# Patient Record
Sex: Male | Born: 1970 | Hispanic: No | Marital: Married | Smoking: Never smoker
Health system: Southern US, Community
[De-identification: ages and names within clinical notes are randomized; demographics above are authoritative.]

## PROBLEM LIST (undated history)

## (undated) DIAGNOSIS — R011 Cardiac murmur, unspecified: Secondary | ICD-10-CM

## (undated) HISTORY — DX: Cardiac murmur, unspecified: R01.1

---

## 2004-05-11 ENCOUNTER — Encounter: Admission: RE | Admit: 2004-05-11 | Discharge: 2004-05-11 | Payer: Self-pay | Admitting: Internal Medicine

## 2004-05-11 ENCOUNTER — Ambulatory Visit (HOSPITAL_COMMUNITY): Admission: RE | Admit: 2004-05-11 | Discharge: 2004-05-11 | Payer: Self-pay | Admitting: Internal Medicine

## 2006-04-11 ENCOUNTER — Ambulatory Visit: Payer: Self-pay | Admitting: Internal Medicine

## 2007-04-21 ENCOUNTER — Telehealth (INDEPENDENT_AMBULATORY_CARE_PROVIDER_SITE_OTHER): Payer: Self-pay | Admitting: *Deleted

## 2008-01-15 ENCOUNTER — Ambulatory Visit: Payer: Self-pay | Admitting: Internal Medicine

## 2008-02-11 ENCOUNTER — Ambulatory Visit: Payer: Self-pay | Admitting: Internal Medicine

## 2008-02-11 LAB — CONVERTED CEMR LAB
ALT: 33 units/L (ref 0–53)
AST: 21 units/L (ref 0–37)
Albumin: 4.6 g/dL (ref 3.5–5.2)
Basophils Absolute: 0.1 10*3/uL (ref 0.0–0.1)
Basophils Relative: 1 % (ref 0–1)
Chloride: 106 meq/L (ref 96–112)
Eosinophils Absolute: 0.2 10*3/uL (ref 0.0–0.7)
Eosinophils Relative: 3 % (ref 0–5)
HCT: 49.5 % (ref 39.0–52.0)
LDL Cholesterol: 72 mg/dL (ref 0–99)
Lymphocytes Relative: 36 % (ref 12–46)
Lymphs Abs: 2.7 10*3/uL (ref 0.7–4.0)
Monocytes Relative: 9 % (ref 3–12)
Neutro Abs: 3.8 10*3/uL (ref 1.7–7.7)
Platelets: 194 10*3/uL (ref 150–400)
Total CHOL/HDL Ratio: 3.3
Total Protein: 7 g/dL (ref 6.0–8.3)

## 2008-02-26 ENCOUNTER — Ambulatory Visit: Payer: Self-pay | Admitting: Internal Medicine

## 2008-05-26 ENCOUNTER — Ambulatory Visit: Payer: Self-pay | Admitting: Family Medicine

## 2009-10-26 ENCOUNTER — Ambulatory Visit: Payer: Self-pay | Admitting: Internal Medicine

## 2009-11-16 ENCOUNTER — Ambulatory Visit: Payer: Self-pay | Admitting: Internal Medicine

## 2010-02-21 ENCOUNTER — Ambulatory Visit: Payer: Self-pay | Admitting: Family Medicine

## 2010-04-13 ENCOUNTER — Ambulatory Visit: Payer: Self-pay | Admitting: Internal Medicine

## 2014-02-09 ENCOUNTER — Ambulatory Visit (INDEPENDENT_AMBULATORY_CARE_PROVIDER_SITE_OTHER): Payer: 59 | Admitting: Family Medicine

## 2014-02-09 VITALS — BP 126/78 | HR 53 | Temp 98.4°F | Resp 17 | Ht 66.5 in | Wt 170.0 lb

## 2014-02-09 DIAGNOSIS — R011 Cardiac murmur, unspecified: Secondary | ICD-10-CM

## 2014-02-09 DIAGNOSIS — R42 Dizziness and giddiness: Secondary | ICD-10-CM

## 2014-02-09 DIAGNOSIS — I341 Nonrheumatic mitral (valve) prolapse: Secondary | ICD-10-CM | POA: Insufficient documentation

## 2014-02-09 LAB — POCT CBC
Granulocyte percent: 52.4 %G (ref 37–80)
HCT, POC: 46.9 % (ref 43.5–53.7)
Hemoglobin: 15.8 g/dL (ref 14.1–18.1)
Lymph, poc: 3 (ref 0.6–3.4)
MCH, POC: 29.4 pg (ref 27–31.2)
MCHC: 33.7 g/dL (ref 31.8–35.4)
MCV: 87.2 fL (ref 80–97)
MID (cbc): 0.7 (ref 0–0.9)
MPV: 9.9 fL (ref 0–99.8)
POC Granulocyte: 4 (ref 2–6.9)
POC LYMPH PERCENT: 38.7 %L (ref 10–50)
POC MID %: 8.9 %M (ref 0–12)
Platelet Count, POC: 209 10*3/uL (ref 142–424)
RBC: 5.38 M/uL (ref 4.69–6.13)
RDW, POC: 13.2 %
WBC: 7.7 10*3/uL (ref 4.6–10.2)

## 2014-02-09 LAB — COMPREHENSIVE METABOLIC PANEL
ALT: 35 U/L (ref 0–53)
AST: 21 U/L (ref 0–37)
Albumin: 4.8 g/dL (ref 3.5–5.2)
Alkaline Phosphatase: 62 U/L (ref 39–117)
BUN: 17 mg/dL (ref 6–23)
CO2: 27 mEq/L (ref 19–32)
Calcium: 10 mg/dL (ref 8.4–10.5)
Chloride: 103 mEq/L (ref 96–112)
Creat: 0.9 mg/dL (ref 0.50–1.35)
Glucose, Bld: 95 mg/dL (ref 70–99)
Potassium: 4.1 mEq/L (ref 3.5–5.3)
Sodium: 139 mEq/L (ref 135–145)
Total Bilirubin: 0.6 mg/dL (ref 0.2–1.2)
Total Protein: 7.5 g/dL (ref 6.0–8.3)

## 2014-02-09 LAB — LIPID PANEL
Cholesterol: 136 mg/dL (ref 0–200)
HDL: 41 mg/dL (ref >39)
LDL Cholesterol: 64 mg/dL (ref 0–99)
Total CHOL/HDL Ratio: 3.3 Ratio
Triglycerides: 156 mg/dL — ABNORMAL HIGH (ref <150)
VLDL: 31 mg/dL (ref 0–40)

## 2014-02-09 LAB — POCT SEDIMENTATION RATE: POCT SED RATE: 6 mm/hr (ref 0–22)

## 2014-02-09 NOTE — Progress Notes (Signed)
Is a 43 year old gentleman who works as a Freight forwarder who comes in with recurrent dizziness. He had similar symptoms 3 weeks ago which cleared. He now has the symptoms again. The symptoms are mild and are not related to position. They did wake him up yesterday from sleep.  Patient is married and comes from [place]. He has a son in school who is responsible for in the afternoon.  Patient's a nonsmoker and nondrinker  Patient has no history of shortness of breath or chest pain. He denies any history of heart murmur or heart problems. He has no edema.  No h/o fever  Objective: No acute distress, patient alert and very friendly HEENT: Normal fundi, normal TMs, normal oropharynx Neurologically: Patient is appropriate, oriented,  showing good judgment Cranial nerves III through XII are intact with no diplopia. Neck: Supple no adenopathy or thyromegaly Chest: Clear Heart: Loud 2-3/7 systolic murmur best heard over the entire precordium, regular, no click or rub Abdomen: Soft nontender Extremities no edema, small splinter hemorrhages are on several of his fingernails.  Assessment: Unexplained loud murmur with atypical dizziness.  Plan:Heart murmur - Plan: Ambulatory referral to Cardiology, POCT CBC, POCT SEDIMENTATION RATE, Comprehensive metabolic panel, Lipid panel  Dizziness and giddiness - Plan: Ambulatory referral to Cardiology, POCT CBC, POCT SEDIMENTATION RATE, Comprehensive metabolic panel, Lipid panel  Signed, [name], MD

## 2014-02-10 ENCOUNTER — Ambulatory Visit (HOSPITAL_COMMUNITY)
Admission: RE | Admit: 2014-02-10 | Discharge: 2014-02-10 | Disposition: A | Discharge status: Home / Self Care | Payer: 59 | Source: Ambulatory Visit | Attending: Cardiology | Admitting: Cardiology

## 2014-02-10 ENCOUNTER — Ambulatory Visit (INDEPENDENT_AMBULATORY_CARE_PROVIDER_SITE_OTHER): Payer: 59 | Admitting: Cardiology

## 2014-02-10 ENCOUNTER — Encounter: Payer: Self-pay | Admitting: Cardiology

## 2014-02-10 VITALS — BP 120/80 | HR 58 | Ht 70.0 in | Wt 166.0 lb

## 2014-02-10 DIAGNOSIS — R011 Cardiac murmur, unspecified: Secondary | ICD-10-CM

## 2014-02-10 DIAGNOSIS — I059 Rheumatic mitral valve disease, unspecified: Secondary | ICD-10-CM

## 2014-02-10 NOTE — Patient Instructions (Signed)
Schedule to return for an ultrasound of heart to evaluate murmur.

## 2014-02-10 NOTE — Assessment & Plan Note (Signed)
Newly diagnosed cardiac murmur with the characteristics of mitral regurgitation. Will evaluated valve anatomy with a 2D echocardiogram. He denies further palpitations and no heart failure symptoms. Also w/o signs of heart failure on physical exam. F/u with Dr. Debara Pickett after 2D echo to discus potential treatment options.

## 2014-02-10 NOTE — Progress Notes (Signed)
Patient ID: Damon Jacobs, male   DOB: 1971/04/13, 43 y.o.   MRN: [id number]    02/10/2014 Damon Jacobs   1971-10-26  [id number]  Primary Physician: No established PCP Primary Cardiologist: New  HPI:  Damon Jacobs is a 43 y/o [place] male, with no PMH, who has been referred by Dr. [name] of [facility] for evaluation of a newly diagnosed cardiac murmur. Damon Jacobs denies any knowlage of any heart murmurs or cardiac issues. He is not on any medications and does not have an established PCP. He simply went to the urgent care 2 days ago for evaluation of intermittent dizziness occurring over a 2 day period. He denies palpitations, CP, SOB, orthopnea, PND, syncope and near syncope. He also denies fever, chills and IV drug use. He states that he occasionally plays soccer with his son and denies any limitations due to chest pain or dyspnea. He denies any further dizzy spells since being evaluated by Dr. Joseph Art.    No current outpatient prescriptions on file.   No current facility-administered medications for this visit.    No Known Allergies  History   Social History  . Marital Status: Married    Spouse Name: N/A    Number of Children: N/A  . Years of Education: N/A   Occupational History  . Not on file.   Social History Main Topics  . Smoking status: Never Smoker   . Smokeless tobacco: Not on file  . Alcohol Use: Not on file  . Drug Use: Not on file  . Sexual Activity: Not on file   Other Topics Concern  . Not on file   Social History Narrative  . No narrative on file     Review of Systems: General: negative for chills, fever, night sweats or weight changes.  Cardiovascular: negative for chest pain, dyspnea on exertion, edema, orthopnea, palpitations, paroxysmal nocturnal dyspnea or shortness of breath Dermatological: negative for rash Respiratory: negative for cough or wheezing Urologic: negative for hematuria Abdominal: negative for nausea, vomiting,  diarrhea, bright red blood per rectum, melena, or hematemesis Neurologic: negative for visual changes, syncope, positive for dizziness All other systems reviewed and are otherwise negative except as noted above.    Blood pressure 120/80, pulse 58, height 5\' 10"  (1.778 m), weight 166 lb (75.297 kg).  General appearance: alert, cooperative and no distress Neck: no carotid bruit and no JVD Lungs: clear to auscultation bilaterally and radiation of murmur heard over mid left posterior thorax Heart: regular rate and rhythm and loud 3/6 holosystolic blowing murmur heard throughout the percordium. Loudest at the apex. No clicks. No significant change with valsalva. Abdomen: + radiation of murmur to LUQ Extremities: no LEE Pulses: 2+ and symmetric Skin: warm and dry Neurologic: grossly normal  EKG Sinus Bradycardia w/ ventricular rate of 59 bpm   ASSESSMENT AND PLAN:   Systolic murmur Newly diagnosed cardiac murmur with the characteristics of mitral regurgitation. Will evaluated valve anatomy with a 2D echocardiogram. He denies further palpitations and no heart failure symptoms. Also w/o signs of heart failure on physical exam. F/u with Dr. Debara Pickett after 2D echo to discus potential treatment options.      PLAN  2D echo to assess valve anatomy. F/u with Dr. Debara Pickett post echo.     SIMMONS, BRITTAINYPA-C 02/10/2014 11:39 PM   Pt. Seen and examined. Agree with the NP/PA-C note as written.  Loud holosystolic murmur at the apex, radiates to the left scapula. Concerning for MR.  Agree  with 2D echo.  I'm not convinced this is contributing to his dizziness, however.  Further recommendations will be made once we review his echo.  Pixie Casino, MD, [facility] Attending Cardiologist [place]

## 2014-02-14 ENCOUNTER — Encounter: Payer: Self-pay | Admitting: Cardiology

## 2014-03-11 ENCOUNTER — Telehealth: Payer: Self-pay | Admitting: *Deleted

## 2014-03-11 ENCOUNTER — Telehealth: Payer: Self-pay | Admitting: Cardiology

## 2014-03-11 NOTE — Telephone Encounter (Signed)
Pt called back again upset because he wants his results. He stated that it has been a month.

## 2014-03-11 NOTE — Telephone Encounter (Signed)
Returned call.  Left message to call back before 4pm.  

## 2014-03-11 NOTE — Telephone Encounter (Signed)
Returning your call. °

## 2014-03-11 NOTE — Telephone Encounter (Signed)
Had an echo and wants to know the results  . Please Call  Thanks

## 2014-03-11 NOTE — Telephone Encounter (Signed)
Returned call.  Left message w/o pt-identifiable info that results have not been reviewed and in process of having them reviewed.  Will be notified by nurse once done.  Also to call back before 4pm if questions.

## 2014-03-11 NOTE — Telephone Encounter (Signed)
Message forwarded to Lyda Jester, PA-C/JC, LPN.

## 2014-03-11 NOTE — Telephone Encounter (Signed)
PLEASE INFORM PT IF HE CALLS BACK THAT THE PA HAS BEEN NOTIFIED OF HIS REQUEST AND HE WILL BE NOTIFIED AS SOON AS THE RESULTS ARE GIVEN TO THE NURSE.

## 2014-03-14 NOTE — Telephone Encounter (Signed)
Pt called again today.Told him a nurse would call him back once a P A reviewed it.

## 2014-03-28 ENCOUNTER — Telehealth: Payer: Self-pay | Admitting: Cardiology

## 2014-03-28 NOTE — Telephone Encounter (Signed)
Pt says nobody told him about his echo test he had on 02-10-14.

## 2014-03-29 NOTE — Telephone Encounter (Signed)
Message forwarded to Lyda Jester, PA-C/JC, LPN to contact pt w/ results.

## 2014-04-05 NOTE — Telephone Encounter (Signed)
Pt states that he is so upset because nobody will call him back.Please,please call pt today.

## 2014-04-05 NOTE — Telephone Encounter (Signed)
Pt just called again,says he is waiting to hear from somebody.

## 2014-04-05 NOTE — Telephone Encounter (Signed)
Per JC, LPN, Lyda Jester, PA-C stated pt was instructed to f/u with Dr. Debara Pickett to get results.  Pt will need to be seen in f/u for results.    Returned call and pt verified x 2.  Pt informed per Rosita Fire, PA-C and verbalized understanding.  Pt asked if he had to pay $1500 and RN informed pt not in billing and he would have to address that with billing.  Pt also informed he would likely have to pay his specialist copay and that the $1500 was likely his responsibility for the test.  Pt verbalized understanding and stated he was told to call (RN unable to understand place pt named) when he had a question about how much to pay.  Stated he will call back for an appt.

## 2014-04-05 NOTE — Telephone Encounter (Signed)
Brittainy or JC, please contact this patient w/ his results.

## 2014-04-07 ENCOUNTER — Ambulatory Visit: Payer: 59 | Admitting: Cardiology

## 2014-04-07 ENCOUNTER — Encounter: Payer: Self-pay | Admitting: Family Medicine

## 2014-04-07 ENCOUNTER — Ambulatory Visit (INDEPENDENT_AMBULATORY_CARE_PROVIDER_SITE_OTHER): Payer: 59 | Admitting: Family Medicine

## 2014-04-07 VITALS — BP 122/78 | HR 68 | Temp 97.1°F | Resp 16 | Ht 65.5 in | Wt 168.0 lb

## 2014-04-07 DIAGNOSIS — I059 Rheumatic mitral valve disease, unspecified: Secondary | ICD-10-CM

## 2014-04-07 DIAGNOSIS — I341 Nonrheumatic mitral (valve) prolapse: Secondary | ICD-10-CM

## 2014-04-07 DIAGNOSIS — Z0000 Encounter for general adult medical examination without abnormal findings: Secondary | ICD-10-CM

## 2014-04-07 DIAGNOSIS — R011 Cardiac murmur, unspecified: Secondary | ICD-10-CM

## 2014-04-07 NOTE — Progress Notes (Signed)
Subjective:    Patient ID: Damon Jacobs, male    DOB: [birth year], 43 y.o.   MRN: [id number] This chart was scribed for Damon Haber, MD by Anastasia Pall, ED Scribe. This patient was seen in room 25 and the patient's care was started at 9:48 AM.  Chief Complaint  Patient presents with   Annual Exam   HPI Damon Jacobs is a 43 y.o. male with history below who presents for an annual physical exam.   He denies any problems while driving. He denies any other symptoms.   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  Patient Active Problem List   Diagnosis Date Noted   Mitral valve prolapse 02/09/2014   Review of Systems  Constitutional: Negative for fever, chills, diaphoresis, appetite change and fatigue.  HENT: Negative for mouth sores, sore throat and trouble swallowing.   Eyes: Negative for visual disturbance.  Respiratory: Negative for cough, chest tightness, shortness of breath and wheezing.   Cardiovascular: Negative for chest pain.  Gastrointestinal: Negative for nausea, vomiting, abdominal pain, diarrhea and abdominal distention.  Endocrine: Negative for polydipsia, polyphagia and polyuria.  Genitourinary: Negative for dysuria, frequency and hematuria.  Musculoskeletal: Negative for gait problem.  Skin: Negative for color change, pallor and rash.  Neurological: Negative for dizziness, syncope, light-headedness and headaches.  Hematological: Does not bruise/bleed easily.  Psychiatric/Behavioral: Negative for behavioral problems and confusion.      Objective:   Physical Exam BP 122/78   Pulse 68   Temp(Src) 97.1 F (36.2 C)   Resp 16   Ht 5' 5.5" (1.664 m)   Wt 168 lb (76.204 kg)   BMI 27.52 kg/m2   SpO2 97%  Nursing note and vitals reviewed. Constitutional: He is oriented to person, place, and time. He appears well-developed and well-nourished. No distress.  HENT:  Head: Normocephalic and atraumatic.  Right Ear: Hearing, tympanic membrane, external ear and ear canal  normal.  Left Ear: Hearing, tympanic membrane, external ear and ear canal normal.  Nose: Nose normal.  Mouth/Throat: Uvula is midline, oropharynx is clear and moist and mucous membranes are normal. No oropharyngeal exudate.  Eyes: Conjunctivae and EOM are normal. Pupils are equal, round, and reactive to light. No scleral icterus. No injection. No Nystagmus.  Neck: Normal range of motion. Neck supple. No thyromegaly present.  Cardiovascular: Normal rate, regular rhythm. Loud grade 6-2/2 systolic murmer heard best over entire pericardium.  Pulmonary/Chest: Effort normal and breath sounds normal. No respiratory distress. He has no rales. He exhibits no tenderness.  Abdominal: Soft. Bowel sounds are normal. He exhibits no distension. There is no hepatosplenomegaly. There is no tenderness.  GU: Prostate normal. Musculoskeletal: Normal range of motion. He exhibits no edema and no tenderness.  Lymphadenopathy:    He has no cervical adenopathy.  Neurological: He is alert and oriented to person, place, and time. No cranial nerve deficit or sensory deficit. He exhibits normal muscle tone. He displays a negative Romberg sign. Coordination normal.  Skin: Skin is warm and dry.  Psychiatric: He has a normal mood and affect. His behavior is normal.  Results for orders placed in visit on 02/09/14  COMPREHENSIVE METABOLIC PANEL      Result Value Ref Range   Sodium 139  135 - 145 mEq/L   Potassium 4.1  3.5 - 5.3 mEq/L   Chloride 103  96 - 112 mEq/L   CO2 27  19 - 32 mEq/L   Glucose, Bld 95  70 - 99 mg/dL  BUN 17  6 - 23 mg/dL   Creat 0.90  0.50 - 1.35 mg/dL   Total Bilirubin 0.6  0.2 - 1.2 mg/dL   Alkaline Phosphatase 62  39 - 117 U/L   AST 21  0 - 37 U/L   ALT 35  0 - 53 U/L   Total Protein 7.5  6.0 - 8.3 g/dL   Albumin 4.8  3.5 - 5.2 g/dL   Calcium 10.0  8.4 - 10.5 mg/dL  LIPID PANEL      Result Value Ref Range   Cholesterol 136  0 - 200 mg/dL   Triglycerides 156 (*) <150 mg/dL   HDL 41  >39  mg/dL   Total CHOL/HDL Ratio 3.3     VLDL 31  0 - 40 mg/dL   LDL Cholesterol 64  0 - 99 mg/dL  POCT CBC      Result Value Ref Range   WBC 7.7  4.6 - 10.2 K/uL   Lymph, poc 3.0  0.6 - 3.4   POC LYMPH PERCENT 38.7  10 - 50 %L   MID (cbc) 0.7  0 - 0.9   POC MID % 8.9  0 - 12 %M   POC Granulocyte 4.0  2 - 6.9   Granulocyte percent 52.4  37 - 80 %G   RBC 5.38  4.69 - 6.13 M/uL   Hemoglobin 15.8  14.1 - 18.1 g/dL   HCT, POC 46.9  43.5 - 53.7 %   MCV 87.2  80 - 97 fL   MCH, POC 29.4  27 - 31.2 pg   MCHC 33.7  31.8 - 35.4 g/dL   RDW, POC 13.2     Platelet Count, POC 209  142 - 424 K/uL   MPV 9.9  0 - 99.8 fL  POCT SEDIMENTATION RATE      Result Value Ref Range   POCT SED RATE 6  0 - 22 mm/hr       Assessment & Plan:   Annual physical exam  Mitral valve prolapse   Signed, Damon Haber, MD

## 2014-04-11 NOTE — Telephone Encounter (Signed)
Encounter Closed---04/11/14 TP 

## 2014-07-22 ENCOUNTER — Ambulatory Visit (INDEPENDENT_AMBULATORY_CARE_PROVIDER_SITE_OTHER): Payer: 59

## 2014-07-22 ENCOUNTER — Ambulatory Visit (INDEPENDENT_AMBULATORY_CARE_PROVIDER_SITE_OTHER): Payer: 59 | Admitting: Family Medicine

## 2014-07-22 VITALS — BP 114/76 | HR 64 | Temp 98.0°F | Resp 16 | Ht 66.0 in | Wt 166.0 lb

## 2014-07-22 DIAGNOSIS — R079 Chest pain, unspecified: Secondary | ICD-10-CM

## 2014-07-22 DIAGNOSIS — R0781 Pleurodynia: Secondary | ICD-10-CM

## 2014-07-22 DIAGNOSIS — T148XXA Other injury of unspecified body region, initial encounter: Secondary | ICD-10-CM

## 2014-07-22 MED ORDER — CYCLOBENZAPRINE HCL 5 MG PO TABS: 5.0000 mg | ORAL_TABLET | Freq: Every day | ORAL | Status: DC

## 2014-07-22 MED ORDER — MELOXICAM 15 MG PO TABS: 15.0000 mg | ORAL_TABLET | Freq: Every day | ORAL | Status: DC

## 2014-07-22 NOTE — Progress Notes (Signed)
 Chief Complaint:  Chief Complaint  Patient presents with  . Flank Pain    x 2 days, right    HPI: Damon Jacobs is a 43 y.o. male who is here for  2 day history of rib pain  He works 6 days 12 hours and does a lot of heavy lifting, similar sxs in the past and had meds that took one week to work He does a lot of pulling, pushing, lifting, he works in a warehouse He deneis any Cp, SOB, n/v/abd pain, n/w/t HAs not tried anything for it   History reviewed. No pertinent past medical history. History reviewed. No pertinent past surgical history. History   Social History  . Marital Status: Married    Spouse Name: N/A    Number of Children: N/A  . Years of Education: N/A   Occupational History  . forklift driver    Social History Main Topics  . Smoking status: Never Smoker   . Smokeless tobacco: None  . Alcohol Use: No  . Drug Use: No  . Sexual Activity: Yes   Other Topics Concern  . None   Social History Narrative   Married   2 children   From [place]   History reviewed. No pertinent family history. No Known Allergies Prior to Admission medications   Not on File     ROS: The patient denies fevers, chills, night sweats, unintentional weight loss, chest pain, palpitations, wheezing, dyspnea on exertion, nausea, vomiting, abdominal pain, dysuria, hematuria, melena, numbness, weakness, or tingling.   All other systems have been reviewed and were otherwise negative with the exception of those mentioned in the HPI and as above.    PHYSICAL EXAM: Filed Vitals:   07/22/14 1629  BP: 114/76  Pulse: 64  Temp: 98 F (36.7 C)  Resp: 16   Filed Vitals:   07/22/14 1629  Height: 5\' 6"  (1.676 m)  Weight: 166 lb (75.297 kg)   Body mass index is 26.81 kg/(m^2).  General: Alert, no acute distress HEENT:  Normocephalic, atraumatic, oropharynx patent. EOMI, PERRLA Cardiovascular:  Regular rate and rhythm, no rubs murmurs or gallops.  No Carotid bruits, radial pulse  intact. No pedal edema.  Respiratory: Clear to auscultation bilaterally.  No wheezes, rales, or rhonchi.  No cyanosis, no use of accessory musculature GI: No organomegaly, abdomen is soft and non-tender, positive bowel sounds.  No masses. Skin: No rashes. Neurologic: Facial musculature symmetric. Psychiatric: Patient is appropriate throughout our interaction. Lymphatic: No cervical lymphadenopathy Musculoskeletal: Gait intact. Minimal  paramsk tenderness  ribs Full ROM 5/5 strength, 2/2 DTRs     LABS: Results for orders placed in visit on 02/09/14  COMPREHENSIVE METABOLIC PANEL      Result Value Ref Range   Sodium 139  135 - 145 mEq/L   Potassium 4.1  3.5 - 5.3 mEq/L   Chloride 103  96 - 112 mEq/L   CO2 27  19 - 32 mEq/L   Glucose, Bld 95  70 - 99 mg/dL   BUN 17  6 - 23 mg/dL   Creat 0.90  0.50 - 1.35 mg/dL   Total Bilirubin 0.6  0.2 - 1.2 mg/dL   Alkaline Phosphatase 62  39 - 117 U/L   AST 21  0 - 37 U/L   ALT 35  0 - 53 U/L   Total Protein 7.5  6.0 - 8.3 g/dL   Albumin 4.8  3.5 - 5.2 g/dL   Calcium 10.0  8.4 -  10.5 mg/dL  LIPID PANEL      Result Value Ref Range   Cholesterol 136  0 - 200 mg/dL   Triglycerides 156 (*) <150 mg/dL   HDL 41  >39 mg/dL   Total CHOL/HDL Ratio 3.3     VLDL 31  0 - 40 mg/dL   LDL Cholesterol 64  0 - 99 mg/dL  POCT CBC      Result Value Ref Range   WBC 7.7  4.6 - 10.2 K/uL   Lymph, poc 3.0  0.6 - 3.4   POC LYMPH PERCENT 38.7  10 - 50 %L   MID (cbc) 0.7  0 - 0.9   POC MID % 8.9  0 - 12 %M   POC Granulocyte 4.0  2 - 6.9   Granulocyte percent 52.4  37 - 80 %G   RBC 5.38  4.69 - 6.13 M/uL   Hemoglobin 15.8  14.1 - 18.1 g/dL   HCT, POC 46.9  43.5 - 53.7 %   MCV 87.2  80 - 97 fL   MCH, POC 29.4  27 - 31.2 pg   MCHC 33.7  31.8 - 35.4 g/dL   RDW, POC 13.2     Platelet Count, POC 209  142 - 424 K/uL   MPV 9.9  0 - 99.8 fL  POCT SEDIMENTATION RATE      Result Value Ref Range   POCT SED RATE 6  0 - 22 mm/hr     EKG/XRAY:   Primary read  interpreted by Dr. Marin Comment at [facility]. Neg for fracture or dislocation Please comment on soft tissue if there is anything abnormal   ASSESSMENT/PLAN: Encounter Diagnoses  Name Primary?  . Rib pain on right side Yes  . Sprain and strain    Rx Mobic and also flexeril with precautions F/u prn  Gross sideeffects, risk and benefits, and alternatives of medications d/w patient. Patient is aware that all medications have potential sideeffects and we are unable to predict every sideeffect or drug-drug interaction that may occur.  , Haring, DO 07/22/2014 6:08 PM

## 2015-01-26 ENCOUNTER — Ambulatory Visit (INDEPENDENT_AMBULATORY_CARE_PROVIDER_SITE_OTHER): Payer: 59 | Admitting: Physician Assistant

## 2015-01-26 VITALS — BP 120/80 | HR 80 | Temp 98.2°F | Resp 16 | Ht 66.5 in | Wt 171.0 lb

## 2015-01-26 DIAGNOSIS — I341 Nonrheumatic mitral (valve) prolapse: Secondary | ICD-10-CM

## 2015-01-26 DIAGNOSIS — H11009 Unspecified pterygium of unspecified eye: Secondary | ICD-10-CM | POA: Insufficient documentation

## 2015-01-26 DIAGNOSIS — R0981 Nasal congestion: Secondary | ICD-10-CM

## 2015-01-26 DIAGNOSIS — H11003 Unspecified pterygium of eye, bilateral: Secondary | ICD-10-CM

## 2015-01-26 MED ORDER — IPRATROPIUM BROMIDE 0.03 % NA SOLN: 2.0000 | Freq: Two times a day (BID) | NASAL | Status: DC

## 2015-01-26 NOTE — Progress Notes (Signed)
Subjective:    Patient ID: Damon Jacobs, male    DOB: Sep 29, 1971, 44 y.o.   MRN: [id number]  HPI  This is a 44 year old male who is presenting with 2 days of nasal congestion. Nasal discharge clear and watery. He states he has a slight dry cough but nasal discharge is his main symptom. Denies sore throat, otalgia, facial pain, fever, chills, SOB or wheezing. He took something OTC for cold symptoms but didn't help so stopped taking. He does not have a history of asthma and is not a smoker. No sick contacts.  Pt has bilateral pterygiums - states he has had them for 4 years. He had ophthalmology eval in the past and was [place]. No visual disturbance.  Pt has mitral valve prolapse. Had ECHO several years ago and was told he didn't need continue eval. Pt states ECHO was very expensive and he doesn't want another one. He denies CP, SOB, palpitations, syncope or presyncope.  Review of Systems  Constitutional: Negative for fever and chills.  HENT: Positive for congestion and rhinorrhea. Negative for ear pain, sinus pressure and sore throat.   Eyes: Negative for redness.  Respiratory: Positive for cough. Negative for shortness of breath and wheezing.   Cardiovascular: Negative for chest pain, palpitations and leg swelling.  Gastrointestinal: Negative for nausea, vomiting and abdominal pain.  Skin: Negative for rash.  Allergic/Immunologic: Negative for environmental allergies.  Hematological: Negative for adenopathy.  Psychiatric/Behavioral: Negative for sleep disturbance.    Patient Active Problem List   Diagnosis Date Noted  . Mitral valve prolapse 02/09/2014   Prior to Admission medications   Not on File   No Known Allergies  Patient's social and family history were reviewed.     Objective:   Physical Exam  Constitutional: He is oriented to person, place, and time. He appears well-developed and well-nourished. No distress.  HENT:    Head: Normocephalic and atraumatic.  Right Ear: Hearing, tympanic membrane, external ear and ear canal normal.  Left Ear: Hearing, tympanic membrane, external ear and ear canal normal.  Nose: Mucosal edema present. Right sinus exhibits no maxillary sinus tenderness and no frontal sinus tenderness. Left sinus exhibits no maxillary sinus tenderness and no frontal sinus tenderness.  Mouth/Throat: Uvula is midline and mucous membranes are normal. Posterior oropharyngeal erythema present. No oropharyngeal exudate or posterior oropharyngeal edema.  Eyes: Lids are normal. Pupils are equal, round, and reactive to light. Right eye exhibits no discharge. Left eye exhibits no discharge. No scleral icterus.  Bilateral pterygiums without pupillary encroachment  Cardiovascular: Normal rate, regular rhythm, intact distal pulses and normal pulses.   Murmur heard.  Diastolic (heard best over apex) murmur is present  Pulmonary/Chest: Effort normal and breath sounds normal. No respiratory distress. He has no wheezes. He has no rhonchi. He has no rales.  Musculoskeletal: Normal range of motion.  Lymphadenopathy:       Head (right side): No submental, no submandibular and no tonsillar adenopathy present.       Head (left side): No submental, no submandibular and no tonsillar adenopathy present.    He has no cervical adenopathy.  Neurological: He is alert and oriented to person, place, and time.  Skin: Skin is warm, dry and intact. No lesion and no rash noted.  Psychiatric: He has a normal mood and affect. His speech is normal and behavior is normal. Thought content normal.   BP 120/80 mmHg  Pulse 80  Temp(Src) 98.2 F (36.8 C) (Oral)  Resp 16  Ht 5' 6.5" (1.689 m)  Wt 171 lb (77.565 kg)  BMI 27.19 kg/m2  SpO2 97%     Assessment & Plan:  1. Nasal congestion Will treat with atrovent nasal spray. He will return in 7-10 days if symptoms are not improving. - ipratropium (ATROVENT) 0.03 % nasal spray; Place  2 sprays into both nostrils 2 (two) times daily.  Dispense: 30 mL; Refill: 0  2. Mitral valve prolapse Pt reports he had an ECHO a few years ago and was told he didn't need follow up. I reiterated with him if he ever develops symptoms he needs to be seen again.  3. Pterygium, bilateral He has been evaluated in the past, told all normal. Hasn't grown in several years. I reiterated with him if he develops visual disturbance he needs to be seen again for this.   Benjaman Pott Drenda Freeze, MHS [facility]  01/26/2015

## 2015-01-26 NOTE — Patient Instructions (Signed)
Use nasal spray twice a day until symptoms have improved. Eating spicy food and hot showers can help to clear your sinuses. Return in 5-7 days if your symptoms are not improving. Remember to be seen if you ever get short of breath, pass out, or get chest pain.

## 2015-01-26 NOTE — Progress Notes (Signed)
  Medical screening examination/treatment/procedure(s) were performed by non-physician practitioner and as supervising physician I was immediately available for consultation/collaboration.     

## 2016-05-18 ENCOUNTER — Ambulatory Visit (INDEPENDENT_AMBULATORY_CARE_PROVIDER_SITE_OTHER): Payer: BLUE CROSS/BLUE SHIELD | Admitting: Internal Medicine

## 2016-05-18 ENCOUNTER — Ambulatory Visit (INDEPENDENT_AMBULATORY_CARE_PROVIDER_SITE_OTHER): Payer: BLUE CROSS/BLUE SHIELD

## 2016-05-18 VITALS — BP 122/80 | HR 64 | Temp 98.9°F | Resp 18 | Ht 66.5 in | Wt 171.0 lb

## 2016-05-18 DIAGNOSIS — S20211A Contusion of right front wall of thorax, initial encounter: Secondary | ICD-10-CM | POA: Diagnosis not present

## 2016-05-18 DIAGNOSIS — M546 Pain in thoracic spine: Secondary | ICD-10-CM

## 2016-05-18 NOTE — Progress Notes (Signed)
   Subjective:    Patient ID: Damon Jacobs, male    DOB: 01-31-1971, 45 y.o.   MRN: [id number]  HPI fell yesterday about 7 ft falling backwards onto deck and landing on back. Today with pain posterior ribs or R with movement or lifting/no SOB//No tachycardia No other injuries    Review of Systems [place]    Objective:   Physical Exam  Constitutional: He appears well-developed and well-nourished. No distress.  Eyes: EOM are normal. Pupils are equal, round, and reactive to light.  Neck: Normal range of motion.  Cardiovascular: Normal rate.   Pulmonary/Chest: Effort normal and breath sounds normal. He has no rales.  Abdominal: There is no tenderness.  Musculoskeletal:  R posterior ribs tender in lower post ax line without swelling, defcet or ecchym   BP 122/80 mmHg  Pulse 64  Temp(Src) 98.9 F (37.2 C) (Oral)  Resp 18  Ht 5' 6.5" (1.689 m)  Wt 171 lb (77.565 kg)  BMI 27.19 kg/m2  SpO2 98%   Ribs xray=no fx/no PlEff     Assessment & Plan:  Right-sided thoracic back pain - Plan: DG Ribs Unilateral W/Chest Right  Contusion of ribs, right, initial encounter - Plan: DG Ribs Unilateral W/Chest Right  ---ibuprofen prn ---no lift>20 2 w

## 2016-05-18 NOTE — Patient Instructions (Addendum)
For pain you may take ibuprofen(advil or similar) up to 800mg  every 8 hours if needed    IF you received an x-ray today, you will receive an invoice from [facility]. Please contact [facility] at [phone] with questions or concerns regarding your invoice.   IF you received labwork today, you will receive an invoice from [employer]. Please contact [facility] at [phone] with questions or concerns regarding your invoice.   Our billing staff will not be able to assist you with questions regarding bills from these companies.  You will be contacted with the lab results as soon as they are available. The fastest way to get your results is to activate your My Chart account. Instructions are located on the last page of this paperwork. If you have not heard from [place] regarding the results in 2 weeks, please contact this office.

## 2016-07-18 ENCOUNTER — Ambulatory Visit (INDEPENDENT_AMBULATORY_CARE_PROVIDER_SITE_OTHER): Payer: BLUE CROSS/BLUE SHIELD | Admitting: Physician Assistant

## 2016-07-18 VITALS — BP 120/72 | HR 73 | Temp 98.2°F | Resp 18 | Ht 66.5 in | Wt 167.2 lb

## 2016-07-18 DIAGNOSIS — M26622 Arthralgia of left temporomandibular joint: Secondary | ICD-10-CM

## 2016-07-18 MED ORDER — MELOXICAM 7.5 MG PO TABS: 7.5000 mg | ORAL_TABLET | Freq: Every day | ORAL | 0 refills | Status: AC

## 2016-07-18 NOTE — Progress Notes (Signed)
   Subjective:    Patient ID: Damon Jacobs, male    DOB: Nov 04, 1971, 45 y.o.   MRN: [id number]  HPI Patient has been experiencing LEFT jaw pain x 2 weeks. He feels a popping in his LEFT TMJ. Painful when eating, but not any other time. Pain is mild. He has tried tylenol and had some improvement in pain. No trouble with drinking fluids or talking.   Review of Systems  Constitutional: Negative for fever.  HENT: Negative for dental problem, ear pain, hearing loss, mouth sores and tinnitus.   All other systems reviewed and are negative.  No current outpatient prescriptions on file.   No Known Allergies    Objective:   Physical Exam  Constitutional: He is oriented to person, place, and time. He appears well-developed and well-nourished. He is cooperative. No distress.  Blood pressure 120/72, pulse 73, temperature 98.2 F (36.8 C), temperature source Oral, resp. rate 18, height 5' 6.5" (1.689 m), weight 167 lb 3.2 oz (75.8 kg), SpO2 98 %.  HENT:  Head: Normocephalic and atraumatic.  Right Ear: Hearing, tympanic membrane, external ear and ear canal normal.  Left Ear: Hearing, tympanic membrane, external ear and ear canal normal.  Mouth/Throat: Uvula is midline, oropharynx is clear and moist and mucous membranes are normal. No oral lesions. No trismus in the jaw. Normal dentition. No dental abscesses.  Palpable difference in TMJ on LEFT from RIGHT. Patient chewing gum during exam.  Eyes: Conjunctivae and lids are normal. No scleral icterus.  Neck: Trachea normal and normal range of motion. Neck supple.  Cardiovascular: Normal rate, regular rhythm and normal heart sounds.   Pulmonary/Chest: Effort normal and breath sounds normal.  Musculoskeletal: Normal range of motion.  Lymphadenopathy:    He has no cervical adenopathy.  Neurological: He is alert and oriented to person, place, and time.  Skin: Skin is warm, dry and intact. He is not diaphoretic. No pallor.  Psychiatric: He has a normal  mood and affect. His speech is normal and behavior is normal.        Assessment & Plan:    Arthralgia of left temporomandibular joint - Plan: meloxicam (MOBIC) 7.5 MG tablet ? strain of TMJ vs early TMJ - start Mobic to help with the pain - soft foods, no chewing gum - if no improvement pt will need to see a dentist  Windell Hummingbird PA-C  [facility] 07/19/2016 7:13 PM

## 2016-07-18 NOTE — Patient Instructions (Addendum)
IF you received an x-ray today, you will receive an invoice from [facility]. Please contact [facility] at [phone] with questions or concerns regarding your invoice.   IF you received labwork today, you will receive an invoice from [employer]. Please contact [facility] at [phone] with questions or concerns regarding your invoice.   Our billing staff will not be able to assist you with questions regarding bills from these companies.  You will be contacted with the lab results as soon as they are available. The fastest way to get your results is to activate your My Chart account. Instructions are located on the last page of this paperwork. If you have not heard from [place] regarding the results in 2 weeks, please contact this office.     Temporomandibular Joint Syndrome Temporomandibular joint (TMJ) syndrome is a condition that affects the joints between your jaw and your skull. The TMJs are located near your ears and allow your jaw to open and close. These joints and the nearby muscles are involved in all movements of the jaw. People with TMJ syndrome have pain in the area of these joints and muscles. Chewing, biting, or other movements of the jaw can be difficult or painful. TMJ syndrome can be caused by various things. In many cases, the condition is mild and goes away within a few weeks. For some people, the condition can become a long-term problem. CAUSES Possible causes of TMJ syndrome include:  Grinding your teeth or clenching your jaw. Some people do this when they are under stress.  Arthritis.  Injury to the jaw.  Head or neck injury.  Teeth or dentures that are not aligned well. In some cases, the cause of TMJ syndrome may not be known. SIGNS AND SYMPTOMS The most common symptom is an aching pain on the side of the head in the area of the TMJ. Other symptoms may include:  Pain when moving your jaw, such as when chewing or  biting.  Being unable to open your jaw all the way.  Making a clicking sound when you open your mouth.  Headache.  Earache.  Neck or shoulder pain. DIAGNOSIS Diagnosis can usually be made based on your symptoms, your medical history, and a physical exam. Your health care provider may check the range of motion of your jaw. Imaging tests, such as X-rays or an MRI, are sometimes done. You may need to see your dentist to determine if your teeth and jaw are lined up correctly. TREATMENT TMJ syndrome often goes away on its own. If treatment is needed, the options may include:  Eating soft foods and applying ice or heat.  Medicines to relieve pain or inflammation.  Medicines to relax the muscles.  A splint, bite plate, or mouthpiece to prevent teeth grinding or jaw clenching.  Relaxation techniques or counseling to help reduce stress.  Transcutaneous electrical nerve stimulation (TENS). This helps to relieve pain by applying an electrical current through the skin.  Acupuncture. This is sometimes helpful to relieve pain.  Jaw surgery. This is rarely needed. HOME CARE INSTRUCTIONS  Take medicines only as directed by your health care provider.  Eat a soft diet if you are having trouble chewing.  Apply ice to the painful area.  Put ice in a plastic bag.  Place a towel between your skin and the bag.  Leave the ice on for 20 minutes, 2-3 times a day.  Apply a warm compress to the painful area as directed.  Massage your jaw area and perform any jaw stretching exercises as recommended by your health care provider.  If you were given a mouthpiece or bite plate, wear it as directed.  Avoid foods that require a lot of chewing. Do not chew gum.  Keep all follow-up visits as directed by your health care provider. This is important. SEEK MEDICAL CARE IF:  You are having trouble eating.  You have new or worsening symptoms. SEEK IMMEDIATE MEDICAL CARE IF:  Your jaw locks open or  closed.   This information is not intended to replace advice given to you by your health care provider. Make sure you discuss any questions you have with your health care provider.   Document Released: 08/20/2001 Document Revised: 12/16/2014 Document Reviewed: 06/30/2014 Elsevier Interactive Patient Education [employer].

## 2017-03-20 IMAGING — DX DG RIBS W/ CHEST 3+V*R*
4 series · 4 of 4 positions shown · non-contrast
Comparison: 07/22/2014

CLINICAL DATA: Fall.  Right-sided contusion.

EXAM:
RIGHT RIBS AND CHEST - 3+ VIEW

[chest pa]
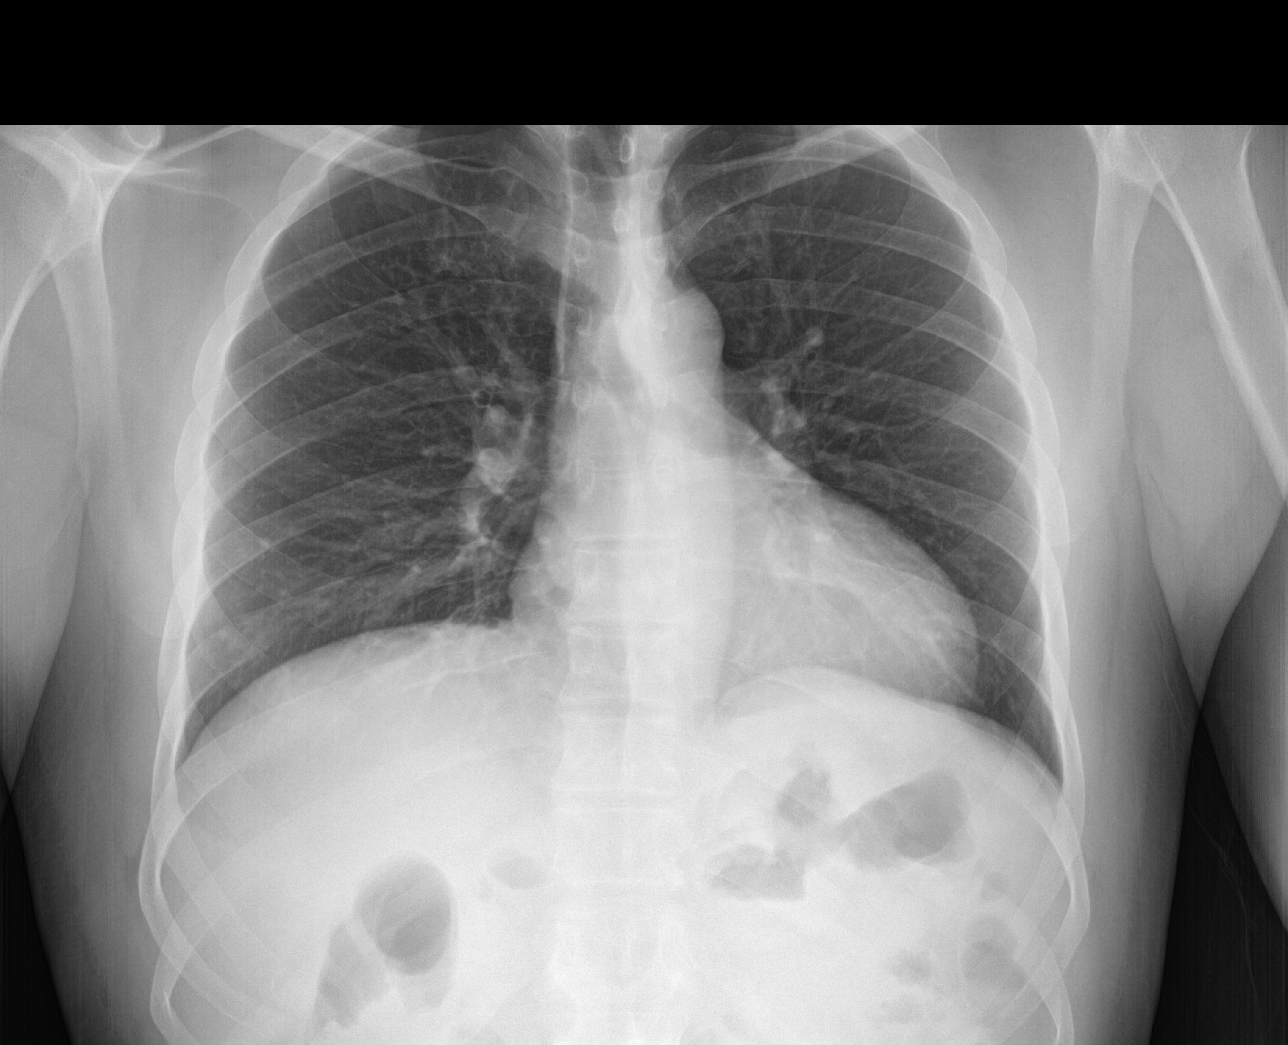

[rib pa]
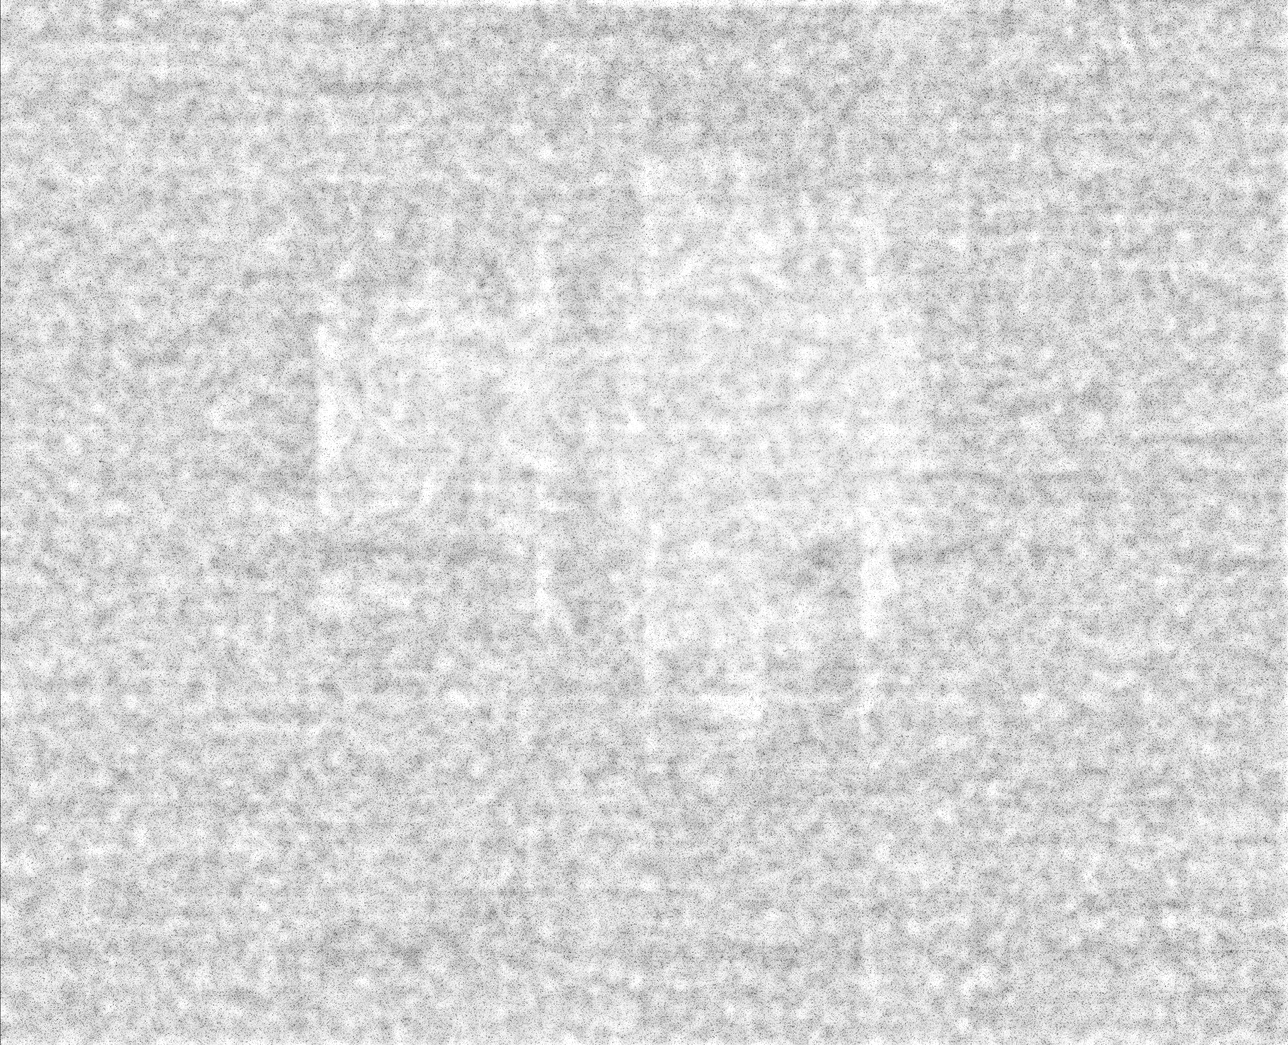

[rib obl (1 of 2)]
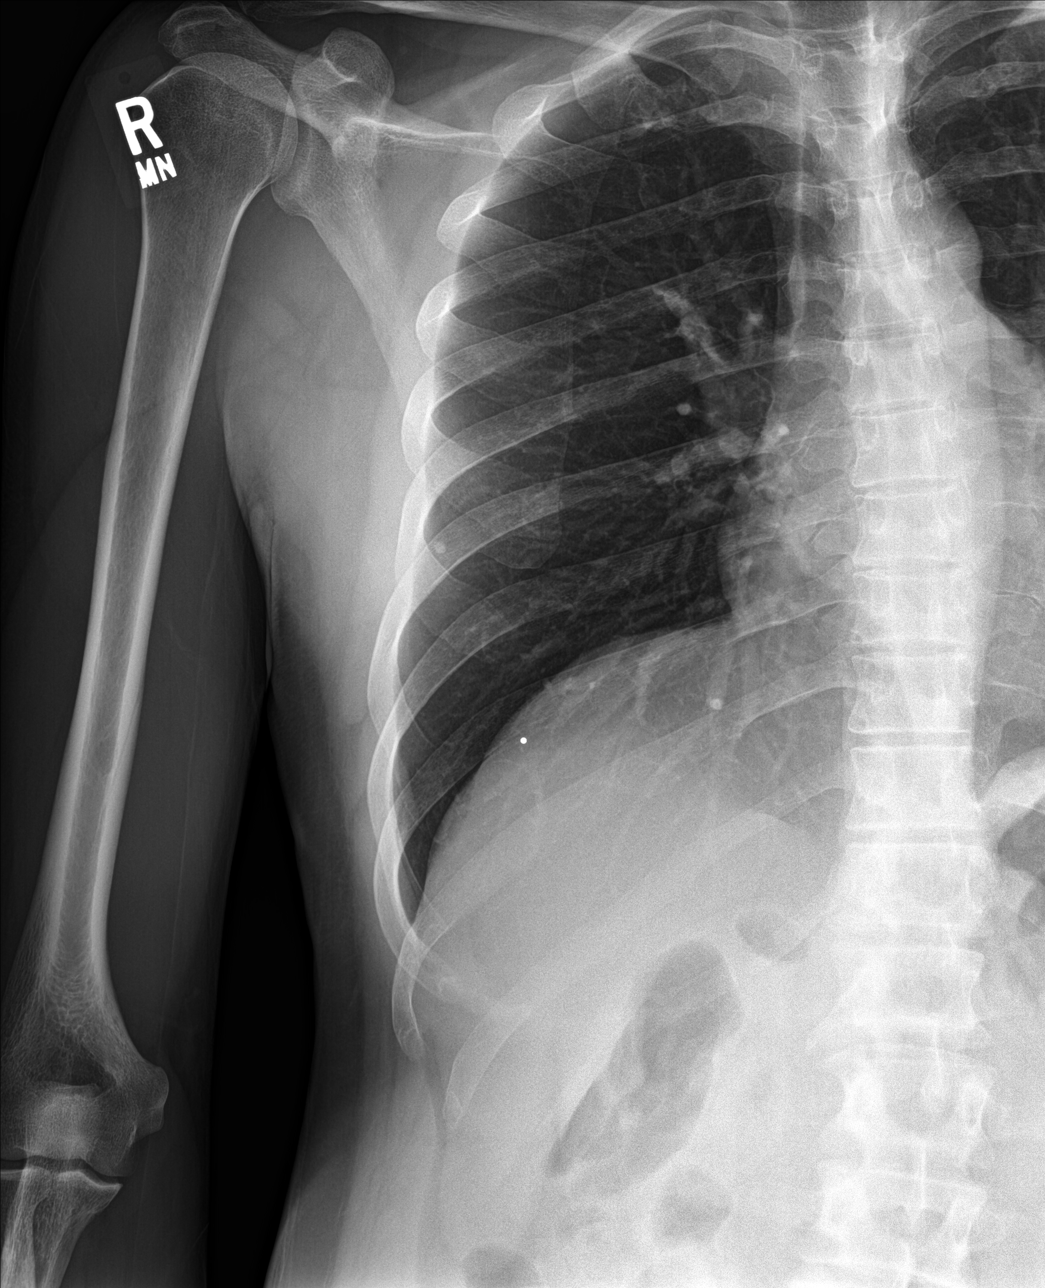

[rib obl (2 of 2)]
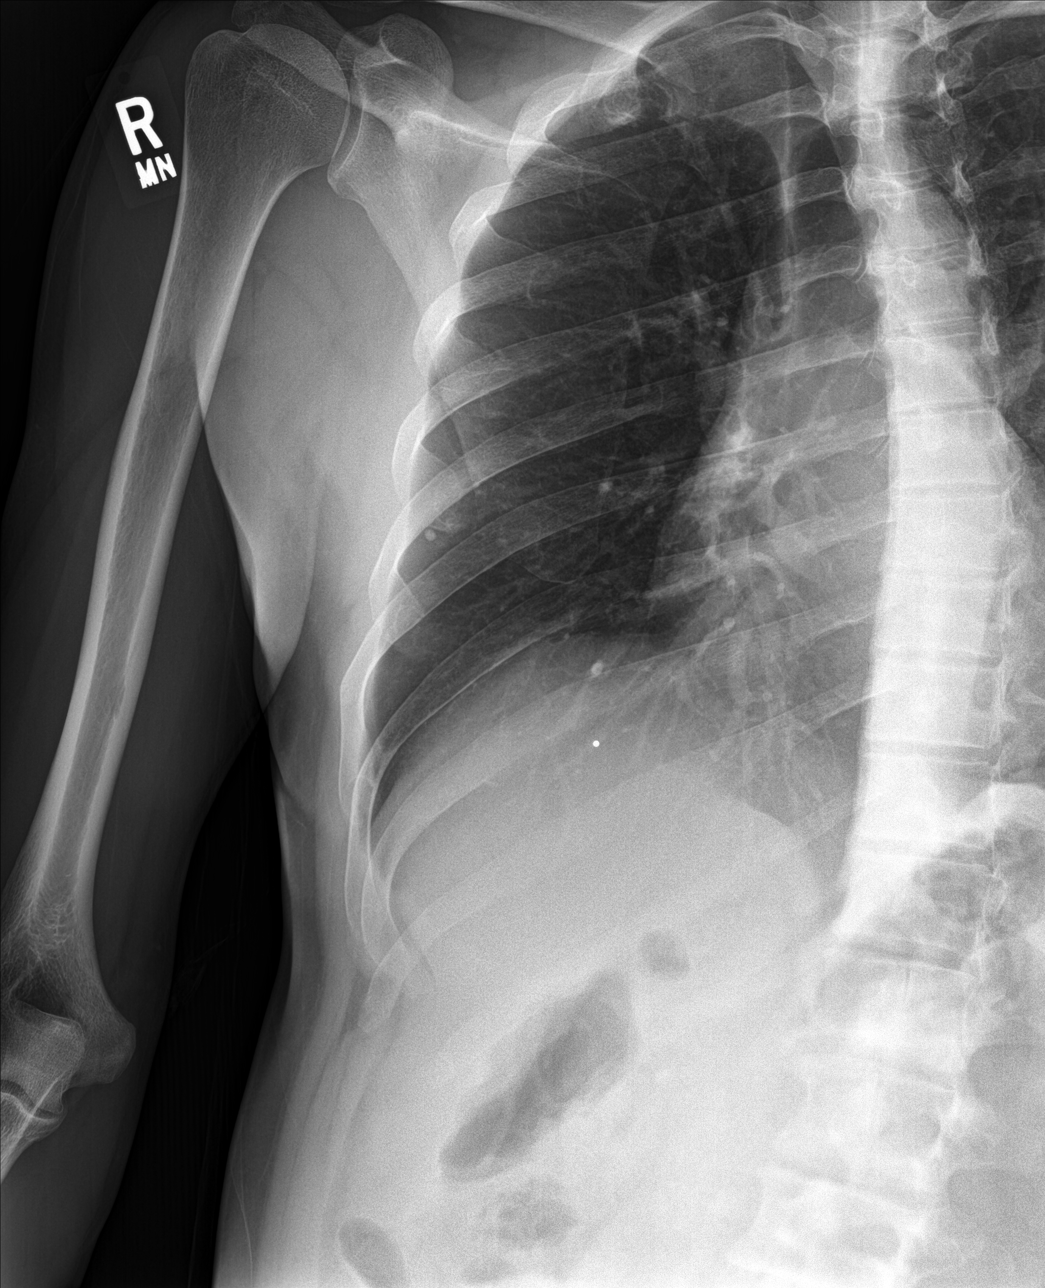

[4 of 4 positions shown; findings below may reference images not displayed]

FINDINGS: Frontal view chest and two views of right-sided ribs. Frontal view
of the chest demonstrates midline trachea. Normal heart size and
mediastinal contours. No pleural effusion or pneumothorax. Lateral
right lower lobe calcified granuloma or granulomaS.

Right-sided rib films demonstrate radiographic marker over the ninth
posterior lateral right rib. No displaced rib fracture.
IMPRESSION: No displaced rib fracture, pleural fluid, or pneumothorax.

## 2017-08-29 ENCOUNTER — Ambulatory Visit (INDEPENDENT_AMBULATORY_CARE_PROVIDER_SITE_OTHER): Payer: 59 | Admitting: Physician Assistant

## 2017-08-29 ENCOUNTER — Encounter: Payer: Self-pay | Admitting: Physician Assistant

## 2017-08-29 VITALS — BP 119/84 | HR 68 | Temp 98.1°F | Resp 16 | Ht 66.93 in | Wt 170.6 lb

## 2017-08-29 DIAGNOSIS — Q383 Other congenital malformations of tongue: Secondary | ICD-10-CM | POA: Diagnosis not present

## 2017-08-29 MED ORDER — NYSTATIN NICU ORAL SYRINGE 100,000 UNITS/ML: 4.0000 mL | Freq: Two times a day (BID) | OROMUCOSAL | 0 refills | Status: AC

## 2017-08-29 MED ORDER — FAMOTIDINE 20 MG PO TABS: 20.0000 mg | ORAL_TABLET | Freq: Every evening | ORAL | 1 refills | Status: AC | PRN

## 2017-08-29 NOTE — Progress Notes (Signed)
08/29/2017 4:49 PM   DOB: 12-10-1970 / MRN: [id number]  SUBJECTIVE:  Damon Jacobs is a 46 y.o. male presenting for a white tongue that started 6 months ago.  Not getting better or worse. No irritation about the mouth.  No other concerning lesions. No sexual partners.   He has No Known Allergies.   He  has no past medical history on file.    He  reports that he has never smoked. He has never used smokeless tobacco. He reports that he does not drink alcohol or use drugs. He  reports that he currently engages in sexual activity. The patient  has no past surgical history on file.  His family history is not on file.  Review of Systems  Constitutional: Negative for chills, diaphoresis and fever.  Gastrointestinal: Positive for heartburn. Negative for abdominal pain, nausea and vomiting.  Skin: Negative for rash.  Neurological: Negative for dizziness.    The problem list and medications were reviewed and updated by myself where necessary and exist elsewhere in the encounter.   OBJECTIVE:  BP 119/84 (BP Location: Right Arm, Patient Position: Sitting, Cuff Size: Large)   Pulse 68   Temp 98.1 F (36.7 C) (Oral)   Resp 16   Ht 5' 6.93" (1.7 m)   Wt 170 lb 9.6 oz (77.4 kg)   SpO2 96%   BMI 26.78 kg/m   Physical Exam  Constitutional: He appears well-developed. He is active and cooperative.  Non-toxic appearance.  HENT:  Right Ear: Hearing, tympanic membrane, external ear and ear canal normal.  Left Ear: Hearing, tympanic membrane, external ear and ear canal normal.  Nose: Nose normal. Right sinus exhibits no maxillary sinus tenderness and no frontal sinus tenderness. Left sinus exhibits no maxillary sinus tenderness and no frontal sinus tenderness.  Mouth/Throat: Uvula is midline, oropharynx is clear and moist and mucous membranes are normal. No oropharyngeal exudate, posterior oropharyngeal edema or tonsillar abscesses.    Eyes: Pupils are equal, round, and reactive to light.  Conjunctivae are normal.  Cardiovascular: Normal rate, regular rhythm, S1 normal, S2 normal, normal heart sounds, intact distal pulses and normal pulses.  Exam reveals no gallop and no friction rub.   No murmur heard. Pulmonary/Chest: Effort normal. No stridor. No tachypnea. No respiratory distress. He has no wheezes. He has no rales.  Abdominal: He exhibits no distension.  Musculoskeletal: He exhibits no edema.  Lymphadenopathy:       Head (right side): No submandibular and no tonsillar adenopathy present.       Head (left side): No submandibular and no tonsillar adenopathy present.    He has no cervical adenopathy.  Neurological: He is alert.  Skin: Skin is warm and dry. He is not diaphoretic. No pallor.  Vitals reviewed.   No results found for this or any previous visit (from the past 72 hour(s)).  No results found.  ASSESSMENT AND PLAN:  [name] was seen today for gastric concerns.  Diagnoses and all orders for this visit:  Tongue abnormality -     nystatin (MYCOSTATIN) 100000 UNITS/ML SUSP; Take 4 mLs by mouth every 12 (twelve) hours. -     famotidine (PEPCID) 20 MG tablet; Take 1 tablet (20 mg total) by mouth at bedtime as needed for heartburn or indigestion.    The patient is advised to call or return to clinic if he does not see an improvement in symptoms, or to seek the care of the closest emergency department if he worsens with the  above plan.   Philis Fendt, MHS, PA-C [facility] 08/29/2017 4:49 PM

## 2017-08-29 NOTE — Patient Instructions (Addendum)
  Come back in 2 weeks if you are still having any problems with your tongue.    IF you received an x-ray today, you will receive an invoice from [facility]. Please contact [facility] at [phone] with questions or concerns regarding your invoice.   IF you received labwork today, you will receive an invoice from [facility]. Please contact [facility] at [phone] with questions or concerns regarding your invoice.   Our billing staff will not be able to assist you with questions regarding bills from these companies.  You will be contacted with the lab results as soon as they are available. The fastest way to get your results is to activate your My Chart account. Instructions are located on the last page of this paperwork. If you have not heard from [place] regarding the results in 2 weeks, please contact this office.

## 2022-12-11 ENCOUNTER — Ambulatory Visit (AMBULATORY_SURGERY_CENTER): Payer: 59

## 2022-12-11 VITALS — Ht 66.0 in | Wt 156.0 lb

## 2022-12-11 DIAGNOSIS — Z1211 Encounter for screening for malignant neoplasm of colon: Secondary | ICD-10-CM

## 2022-12-11 MED ORDER — NA SULFATE-K SULFATE-MG SULF 17.5-3.13-1.6 GM/177ML PO SOLN: 1.0000 | Freq: Once | ORAL | 0 refills | Status: DC

## 2022-12-11 MED ORDER — NA SULFATE-K SULFATE-MG SULF 17.5-3.13-1.6 GM/177ML PO SOLN: 1.0000 | Freq: Once | ORAL | 0 refills | Status: AC

## 2022-12-11 NOTE — Progress Notes (Signed)
No egg or soy allergy known to patient  No issues known to pt with past sedation with any surgeries or procedures Patient denies ever being told they had issues or difficulty with intubation  No FH of Malignant Hyperthermia Pt is not on diet pills Pt is not on  home 02  Pt is not on blood thinners  Pt denies issues with constipation  No A fib or A flutter Have any cardiac testing pending--no Patient's chart reviewed by Osvaldo Angst CNRA prior to previsit and patient appropriate for the [facility].  Previsit completed and red dot placed by patient's name on their procedure day (on provider's schedule).   Pt instructed to use [email or web address] or GoodRx for a price reduction on prep

## 2022-12-30 ENCOUNTER — Encounter: Payer: Self-pay | Admitting: Gastroenterology

## 2022-12-31 ENCOUNTER — Encounter: Payer: Self-pay | Admitting: Gastroenterology

## 2023-01-01 ENCOUNTER — Ambulatory Visit: Payer: 59 | Admitting: Gastroenterology

## 2023-01-01 ENCOUNTER — Encounter: Payer: Self-pay | Admitting: Gastroenterology

## 2023-01-01 VITALS — BP 110/62 | HR 60 | Temp 97.5°F | Resp 17 | Ht 66.0 in | Wt 156.0 lb

## 2023-01-01 DIAGNOSIS — D123 Benign neoplasm of transverse colon: Secondary | ICD-10-CM

## 2023-01-01 DIAGNOSIS — K5140 Inflammatory polyps of colon without complications: Secondary | ICD-10-CM | POA: Diagnosis not present

## 2023-01-01 DIAGNOSIS — Z1211 Encounter for screening for malignant neoplasm of colon: Secondary | ICD-10-CM | POA: Diagnosis not present

## 2023-01-01 DIAGNOSIS — K635 Polyp of colon: Secondary | ICD-10-CM | POA: Diagnosis not present

## 2023-01-01 DIAGNOSIS — D124 Benign neoplasm of descending colon: Secondary | ICD-10-CM

## 2023-01-01 MED ORDER — SODIUM CHLORIDE 0.9 % IV SOLN: 500.0000 mL | Freq: Once | INTRAVENOUS | Status: DC

## 2023-01-01 NOTE — Op Note (Signed)
[facility] Patient Name: Damon Jacobs Procedure Date: 01/01/2023 2:57 PM MRN: [id number] Endoscopist: Nicki Reaper E. Candis Schatz , MD, [id number] Age: 52 Referring MD:  Date of Birth: Mar 31, 1971 Gender: Male Account #: [id number] Procedure:                Colonoscopy Indications:              Screening for colorectal malignant neoplasm, This                            is the patient's first colonoscopy Medicines:                Monitored Anesthesia Care Procedure:                Pre-Anesthesia Assessment:                           - Prior to the procedure, a History and Physical                            was performed, and patient medications and                            allergies were reviewed. The patient's tolerance of                            previous anesthesia was also reviewed. The risks                            and benefits of the procedure and the sedation                            options and risks were discussed with the patient.                            All questions were answered, and informed consent                            was obtained. Prior Anticoagulants: The patient has                            taken no anticoagulant or antiplatelet agents. ASA                            Grade Assessment: II - A patient with mild systemic                            disease. After reviewing the risks and benefits,                            the patient was deemed in satisfactory condition to                            undergo the procedure.  After obtaining informed consent, the colonoscope                            was passed under direct vision. Throughout the                            procedure, the patient's blood pressure, pulse, and                            oxygen saturations were monitored continuously. The                            CF [id number] #[id number] was introduced through the anus                            and advanced to the  the cecum, identified by                            appendiceal orifice and ileocecal valve. The                            colonoscopy was performed without difficulty. The                            patient tolerated the procedure well. The quality                            of the bowel preparation was excellent. The                            ileocecal valve, appendiceal orifice, and rectum                            were photographed. The bowel preparation used was                            SUPREP via split dose instruction. Scope In: 3:26:35 PM Scope Out: 3:44:41 PM Scope Withdrawal Time: 0 hours 12 minutes 11 seconds  Total Procedure Duration: 0 hours 18 minutes 6 seconds  Findings:                 The perianal and digital rectal examinations were                            normal. Pertinent negatives include normal                            sphincter tone and no palpable rectal lesions.                           A 4 mm polyp was found in the distal transverse                            colon. The polyp was mucous-capped. The polyp was  removed with a cold snare. Resection and retrieval                            were complete. Estimated blood loss was minimal.                           A 3 mm polyp was found in the descending colon. The                            polyp was sessile. The polyp was removed with a                            cold snare. Resection and retrieval were complete.                            Estimated blood loss was minimal.                           A few medium-mouthed diverticula were found in the                            sigmoid colon, descending colon, transverse colon                            and ascending colon. There was no evidence of                            diverticular bleeding.                           The exam was otherwise normal throughout the                            examined colon.                            The retroflexed view of the distal rectum and anal                            verge was normal and showed no anal or rectal                            abnormalities. Complications:            No immediate complications. Estimated Blood Loss:     Estimated blood loss was minimal. Impression:               - One 4 mm polyp in the distal transverse colon,                            removed with a cold snare. Resected and retrieved.                           - One 3 mm polyp in the descending colon, removed  with a cold snare. Resected and retrieved.                           - Mild diverticulosis in the sigmoid colon, in the                            descending colon, in the transverse colon and in                            the ascending colon. There was no evidence of                            diverticular bleeding.                           - The distal rectum and anal verge are normal on                            retroflexion view. Recommendation:           - Patient has a contact number available for                            emergencies. The signs and symptoms of potential                            delayed complications were discussed with the                            patient. Return to normal activities tomorrow.                            Written discharge instructions were provided to the                            patient.                           - Resume previous diet.                           - Continue present medications.                           - Await pathology results.                           - Repeat colonoscopy (date not yet determined) for                            surveillance based on pathology results. Smiley Birr E. Candis Schatz, MD 01/01/2023 3:50:54 PM This report has been signed electronically.

## 2023-01-01 NOTE — Progress Notes (Signed)
To pacu, VSS. Report to [email or web address] 

## 2023-01-01 NOTE — Patient Instructions (Signed)
Handouts on polyps and diverticulosis given to you today  Await pathology results  Repeat colonoscopy recommended. Date to be determined based on pathology results.   YOU HAD AN ENDOSCOPIC PROCEDURE TODAY AT [facility]:   Refer to the procedure report that was given to you for any specific questions about what was found during the examination.  If the procedure report does not answer your questions, please call your gastroenterologist to clarify.  If you requested that your care partner not be given the details of your procedure findings, then the procedure report has been included in a sealed envelope for you to review at your convenience later.  YOU SHOULD EXPECT: Some feelings of bloating in the abdomen. Passage of more gas than usual.  Walking can help get rid of the air that was put into your GI tract during the procedure and reduce the bloating. If you had a lower endoscopy (such as a colonoscopy or flexible sigmoidoscopy) you may notice spotting of blood in your stool or on the toilet paper. If you underwent a bowel prep for your procedure, you may not have a normal bowel movement for a few days.  Please Note:  You might notice some irritation and congestion in your nose or some drainage.  This is from the oxygen used during your procedure.  There is no need for concern and it should clear up in a day or so.  SYMPTOMS TO REPORT IMMEDIATELY:  Following lower endoscopy (colonoscopy or flexible sigmoidoscopy):  Excessive amounts of blood in the stool  Significant tenderness or worsening of abdominal pains  Swelling of the abdomen that is new, acute  Fever of 100F or higher  For urgent or emergent issues, a gastroenterologist can be reached at any hour by calling [phone]. Do not use MyChart messaging for urgent concerns.    DIET:  We do recommend a small meal at first, but then you may proceed to your regular diet.  Drink plenty of fluids but you should avoid  alcoholic beverages for 24 hours.  ACTIVITY:  You should plan to take it easy for the rest of today and you should NOT DRIVE or use heavy machinery until tomorrow (because of the sedation medicines used during the test).    FOLLOW UP: Our staff will call the number listed on your records the next business day following your procedure.  We will call around 7:15- 8:00 am to check on you and address any questions or concerns that you may have regarding the information given to you following your procedure. If we do not reach you, we will leave a message.     If any biopsies were taken you will be contacted by phone or by letter within the next 1-3 weeks.  Please call us at [phone] if you have not heard about the biopsies in 3 weeks.    SIGNATURES/CONFIDENTIALITY: You and/or your care partner have signed paperwork which will be entered into your electronic medical record.  These signatures attest to the fact that that the information above on your After Visit Summary has been reviewed and is understood.  Full responsibility of the confidentiality of this discharge information lies with you and/or your care-partner.

## 2023-01-01 NOTE — Progress Notes (Signed)
[facility] History and Physical   Primary Care Physician:  Patient, No Pcp Per   Reason for Procedure:   Colon cancer screening  Plan:    Screening colonoscopy     HPI: Damon Jacobs is a 52 y.o. male undergoing initial average risk screening colonoscopy.  He has no family history of colon cancer and no chronic GI symptoms.    Past Medical History:  Diagnosis Date   Heart murmur     History reviewed. No pertinent surgical history.  Prior to Admission medications   Medication Sig Start Date End Date Taking? Authorizing Provider  famotidine (PEPCID) 20 MG tablet Take 1 tablet (20 mg total) by mouth at bedtime as needed for heartburn or indigestion. Patient not taking: Reported on 12/11/2022 08/29/17   Damon Coop, PA-C  meloxicam (MOBIC) 7.5 MG tablet Take 1 tablet (7.5 mg total) by mouth daily. Patient not taking: Reported on 08/29/2017 07/18/16   Damon Bale, PA-C  nystatin (MYCOSTATIN) 100000 UNITS/ML SUSP Take 4 mLs by mouth every 12 (twelve) hours. Patient not taking: Reported on 12/11/2022 08/29/17   Damon Coop, PA-C    Current Outpatient Medications  Medication Sig Dispense Refill   famotidine (PEPCID) 20 MG tablet Take 1 tablet (20 mg total) by mouth at bedtime as needed for heartburn or indigestion. (Patient not taking: Reported on 12/11/2022) 30 tablet 1   meloxicam (MOBIC) 7.5 MG tablet Take 1 tablet (7.5 mg total) by mouth daily. (Patient not taking: Reported on 08/29/2017) 30 tablet 0   nystatin (MYCOSTATIN) 100000 UNITS/ML SUSP Take 4 mLs by mouth every 12 (twelve) hours. (Patient not taking: Reported on 12/11/2022) 80 mL 0   Current Facility-Administered Medications  Medication Dose Route Frequency Provider Last Rate Last Admin   0.9 %  sodium chloride infusion  500 mL Intravenous Once Damon November, MD        Allergies as of 01/01/2023 - Review Complete 01/01/2023  Allergen Reaction Noted   Peppermint oil Rash 12/11/2022    Family  History  Problem Relation Age of Onset   Colon cancer Neg Hx    Colon polyps Neg Hx    Esophageal cancer Neg Hx    Rectal cancer Neg Hx    Stomach cancer Neg Hx     Social History   Socioeconomic History   Marital status: Married    Spouse name: Not on file   Number of children: Not on file   Years of education: Not on file   Highest education level: Not on file  Occupational History   Occupation: Games developer    Employer: [employer]  Tobacco Use   Smoking status: Never   Smokeless tobacco: Never  Substance and Sexual Activity   Alcohol use: No   Drug use: No   Sexual activity: Yes  Other Topics Concern   Not on file  Social History Narrative   Married   2 children   From [employer]   Social Determinants of Health   Financial Resource Strain: Not on file  Food Insecurity: Not on file  Transportation Needs: Not on file  Physical Activity: Not on file  Stress: Not on file  Social Connections: Not on file  Intimate Partner Violence: Not on file    Review of Systems:  All other review of systems negative except as mentioned in the HPI.  Physical Exam: Vital signs BP 110/65   Pulse (!) 57   Temp (!) 97.5 F (36.4 C)   Ht  $'5\' 6"'o$  (1.676 m)   Wt 156 lb (70.8 kg)   SpO2 99%   BMI 25.18 kg/m   General:   Alert,  Well-developed, well-nourished, pleasant and cooperative in NAD Airway:  Mallampati 2 Lungs:  Clear throughout to auscultation.   Heart:  Regular rate and rhythm; no murmurs, clicks, rubs,  or gallops. Abdomen:  Soft, nontender and nondistended. Normal bowel sounds.   Neuro/Psych:  Normal mood and affect. A and O x 3   Damon Jacobs E. Candis Schatz, MD [facility]

## 2023-01-01 NOTE — Progress Notes (Unsigned)
Called to room to assist during endoscopic procedure.  Patient ID and intended procedure confirmed with present staff. Received instructions for my participation in the procedure from the performing physician.  

## 2023-01-01 NOTE — Progress Notes (Signed)
Pt's states no medical or surgical changes since previsit or office visit. 

## 2023-01-02 ENCOUNTER — Telehealth: Payer: Self-pay

## 2023-01-02 NOTE — Telephone Encounter (Signed)
  Follow up Call-     01/01/2023    2:57 PM  Call back number  Post procedure Call Back phone  # [phone]  Permission to leave phone message Yes    Follow up call made. NALM

## 2023-01-09 ENCOUNTER — Encounter: Payer: Self-pay | Admitting: Gastroenterology
